# Patient Record
Sex: Male | Born: 1985 | Race: White | Hispanic: No | Marital: Married | Smoking: Current every day smoker
Health system: Southern US, Community
[De-identification: ages and names within clinical notes are randomized; demographics above are authoritative.]

## PROBLEM LIST (undated history)

## (undated) HISTORY — PX: BONY PELVIS SURGERY: SHX572

## (undated) HISTORY — PX: KNEE SURGERY: SHX244

## (undated) HISTORY — PX: TONSILLECTOMY: SUR1361

## (undated) HISTORY — PX: INNER EAR SURGERY: SHX679

---

## 2015-08-25 ENCOUNTER — Encounter: Payer: Self-pay | Admitting: Emergency Medicine

## 2015-08-25 ENCOUNTER — Emergency Department: Payer: 59

## 2015-08-25 ENCOUNTER — Emergency Department
Admission: EM | Admit: 2015-08-25 | Discharge: 2015-08-25 | Disposition: A | Discharge status: Home / Self Care | Payer: 59 | Attending: Emergency Medicine | Admitting: Emergency Medicine

## 2015-08-25 DIAGNOSIS — R197 Diarrhea, unspecified: Secondary | ICD-10-CM | POA: Diagnosis present

## 2015-08-25 DIAGNOSIS — Z720 Tobacco use: Secondary | ICD-10-CM | POA: Diagnosis not present

## 2015-08-25 DIAGNOSIS — K529 Noninfective gastroenteritis and colitis, unspecified: Secondary | ICD-10-CM

## 2015-08-25 DIAGNOSIS — R1030 Lower abdominal pain, unspecified: Secondary | ICD-10-CM | POA: Insufficient documentation

## 2015-08-25 LAB — LIPASE, BLOOD: Lipase: 30 U/L (ref 22–51)

## 2015-08-25 LAB — COMPREHENSIVE METABOLIC PANEL
ALBUMIN: 4.9 g/dL (ref 3.5–5.0)
ALK PHOS: 62 U/L (ref 38–126)
ALT: 38 U/L (ref 17–63)
AST: 28 U/L (ref 15–41)
Anion gap: 10 (ref 5–15)
BILIRUBIN TOTAL: 0.9 mg/dL (ref 0.3–1.2)
BUN: 16 mg/dL (ref 6–20)
CO2: 22 mmol/L (ref 22–32)
CREATININE: 0.93 mg/dL (ref 0.61–1.24)
Calcium: 9.4 mg/dL (ref 8.9–10.3)
Chloride: 106 mmol/L (ref 101–111)
GFR calc Af Amer: >60 mL/min (ref >60)
GFR calc non Af Amer: >60 mL/min (ref >60)
GLUCOSE: 131 mg/dL — AB (ref 65–99)
Potassium: 4.3 mmol/L (ref 3.5–5.1)
Sodium: 138 mmol/L (ref 135–145)
TOTAL PROTEIN: 8.3 g/dL — AB (ref 6.5–8.1)

## 2015-08-25 LAB — URINALYSIS COMPLETE WITH MICROSCOPIC (ARMC ONLY)
Bilirubin Urine: NEGATIVE
GLUCOSE, UA: NEGATIVE mg/dL
Hgb urine dipstick: NEGATIVE
Ketones, ur: NEGATIVE mg/dL
NITRITE: NEGATIVE
Protein, ur: NEGATIVE mg/dL
SPECIFIC GRAVITY, URINE: 1.032 — AB (ref 1.005–1.030)
pH: 5 (ref 5.0–8.0)

## 2015-08-25 LAB — CBC
HEMATOCRIT: 46.8 % (ref 40.0–52.0)
Hemoglobin: 16.3 g/dL (ref 13.0–18.0)
MCH: 31.7 pg (ref 26.0–34.0)
MCHC: 34.8 g/dL (ref 32.0–36.0)
MCV: 91.2 fL (ref 80.0–100.0)
Platelets: 254 10*3/uL (ref 150–440)
RBC: 5.13 MIL/uL (ref 4.40–5.90)
RDW: 13.3 % (ref 11.5–14.5)
WBC: 22.1 10*3/uL — ABNORMAL HIGH (ref 3.8–10.6)

## 2015-08-25 MED ORDER — SODIUM CHLORIDE 0.9 % IV BOLUS (SEPSIS)
1000.0000 mL | Freq: Once | INTRAVENOUS | Status: AC
  Administered 2015-08-25: 1000 mL via INTRAVENOUS

## 2015-08-25 MED ORDER — MORPHINE SULFATE (PF) 4 MG/ML IV SOLN
4.0000 mg | Freq: Once | INTRAVENOUS | Status: DC
  Filled 2015-08-25: qty 1

## 2015-08-25 MED ORDER — ONDANSETRON 4 MG PO TBDP: 4.0000 mg | ORAL_TABLET | Freq: Three times a day (TID) | ORAL | Status: AC | PRN

## 2015-08-25 MED ORDER — IOHEXOL 300 MG/ML  SOLN
125.0000 mL | Freq: Once | INTRAMUSCULAR | Status: DC | PRN
  Filled 2015-08-25: qty 125

## 2015-08-25 MED ORDER — ONDANSETRON HCL 4 MG/2ML IJ SOLN
4.0000 mg | Freq: Once | INTRAMUSCULAR | Status: AC | PRN
  Administered 2015-08-25: 4 mg via INTRAVENOUS
  Filled 2015-08-25: qty 2

## 2015-08-25 MED ORDER — SODIUM CHLORIDE 0.9 % IV BOLUS (SEPSIS): 1000.0000 mL | Freq: Once | INTRAVENOUS | Status: DC

## 2015-08-25 MED ORDER — PROMETHAZINE HCL 25 MG/ML IJ SOLN
INTRAMUSCULAR | Status: AC
  Administered 2015-08-25: 25 mg via INTRAVENOUS
  Filled 2015-08-25: qty 1

## 2015-08-25 MED ORDER — PROMETHAZINE HCL 25 MG/ML IJ SOLN
25.0000 mg | Freq: Once | INTRAMUSCULAR | Status: AC
  Administered 2015-08-25: 25 mg via INTRAVENOUS

## 2015-08-25 MED ORDER — IOHEXOL 240 MG/ML SOLN
25.0000 mL | Freq: Once | INTRAMUSCULAR | Status: AC | PRN
  Administered 2015-08-25: 25 mL via ORAL

## 2015-08-25 NOTE — Discharge Instructions (Signed)
You have been seen in the emergency department for nausea, vomiting, diarrhea. Your workup is most consistent with gastroenteritis. Please take your prescribed nausea medication as needed, as written. Please use over-the-counter Imodium/loperamide for diarrhea, as needed, as written on the box. Return to the emergency department if you have any worsening abdominal pain, fever, or any other symptom personally concerning to yourself.

## 2015-08-25 NOTE — ED Notes (Signed)
MD at bedside for reeval

## 2015-08-25 NOTE — ED Notes (Signed)
Patient presents to the ED with nausea, vomiting, and diarrhea starting this morning.  Patient reports vomiting greater than 10 times and diarrhea as often.  Patient states stool is a yellowish color.  Reports emesis that is yellow or green.  Patient denies abdominal pain but reports, "queasiness".  Patient reports lower back pain as well.  Patient reports eating [place] food last night, wife also ate the same meal without any illness at this time.

## 2015-08-25 NOTE — ED Provider Notes (Signed)
[facility] Emergency Department Provider Note  Time seen: 7:12 PM  I have reviewed the triage vital signs and the nursing notes.   HISTORY  Chief Complaint Diarrhea and Emesis    HPI Brandon Booth is a 29 y.o. male with no past medical history who presents the emergency department with nausea, vomiting, abdominal pain. According to the patient since 8:00 this morning he has been very nauseated, vomiting and also having very loose diarrhea. Patient is also having lower abdominal pain. Describes his abdominal pain is moderate, radiates around to bilateral lower back. Denies fever. No modifying factors identified. No recent sick contacts per patient.     History reviewed. No pertinent past medical history.  There are no active problems to display for this patient.   Past Surgical History  Procedure Laterality Date  . Knee surgery    . Tonsillectomy    . Bony pelvis surgery    . Inner ear surgery      No current outpatient prescriptions on file.  Allergies Review of patient's allergies indicates no known allergies.  History reviewed. No pertinent family history.  Social History Social History  Substance Use Topics  . Smoking status: Current Every Day Smoker -- 0.75 packs/day    Types: Cigarettes  . Smokeless tobacco: None  . Alcohol Use: No    Review of Systems Constitutional: Negative for fever. Cardiovascular: Negative for chest pain. Respiratory: Negative for shortness of breath. Gastrointestinal: Positive lower abdominal pain, nausea, vomiting and diarrhea. Genitourinary: Negative for dysuria. Musculoskeletal: Pain radiating around to bilateral lower back. Neurological: Negative for headache Allergic/Immunilogical: **} 10-point ROS otherwise negative.  ____________________________________________   PHYSICAL EXAM:  VITAL SIGNS: ED Triage Vitals  Enc Vitals Group     BP 08/25/15 1818 139/79 mmHg     Pulse Rate 08/25/15 1818  116     Resp 08/25/15 1818 18     Temp --      Temp src --      SpO2 08/25/15 1818 98 %     Weight 08/25/15 1818 244 lb (110.678 kg)     Height 08/25/15 1818  (1.803 m)     Head Cir --      Peak Flow --      Pain Score 08/25/15 1827 6     Pain Loc --      Pain Edu? --      Excl. in GC? --    Constitutional: Alert and oriented. Well appearing and in no distress. Eyes: Normal exam ENT   Head: Normocephalic and atraumatic.   Mouth/Throat: Mucous membranes are moist. Cardiovascular: Regular rhythm, rate around 100 bpm. No murmur. Respiratory: Normal respiratory effort without tachypnea nor retractions. Breath sounds are clear and equal bilaterally. No wheezes/rales/rhonchi. Gastrointestinal: Soft, moderate lower abdominal tenderness, moderate right lower quadrant tenderness. No rebound or guarding. No distention. No CVA tenderness. Mild lumbar paraspinal tenderness palpation bilaterally. Musculoskeletal: Nontender with normal range of motion in all extremities. Neurologic:  Normal speech and language. No gross focal neurologic deficits are appreciated. Speech is normal. Skin:  Skin is warm, dry and intact.  Psychiatric: Mood and affect are normal. Speech and behavior are normal. Patient exhibits appropriate insight and judgment.  ____________________________________________      RADIOLOGY  CT shows fluid throughout colon.  ____________________________________________   INITIAL IMPRESSION / ASSESSMENT AND PLAN / ED COURSE  Pertinent labs & imaging results that were available during my care of the patient were reviewed by me and considered  in my medical decision making (see chart for details).  Patient presents for lower abdominal pain, lower and right lower quadrant abdominal tenderness on exam. Patient nauseated with vomiting and liquid diarrhea. Elevated white blood cell count of 22,000, given his tenderness and elevated white blood cell count we'll proceed with a  CT abdomen/pelvis. I discussed this with the patient is agreeable.  CT shows fluid throughout colon most consistent with diarrhea. Given this finding along with his symptoms patient is most likely experiencing a bout of gastroenteritis. Will discharge on Zofran, loperamide as needed. I discussed my typical abdominal pain return precautions which the patient is agreeable.  ____________________________________________   FINAL CLINICAL IMPRESSION(S) / ED DIAGNOSES  Abdominal pain Nausea and vomiting Diarrhea Gastroenteritis  Minna Antis, MD 08/25/15 2032

## 2016-02-08 IMAGING — CT CT ABD-PELV W/ CM
1 of 2 series · 15 of 32 positions shown, 19 images · IV contrast (omnipaque)
Comparison: None.

CLINICAL DATA: 29-year-old male with nausea vomiting diarrhea since
this morning. Initial encounter.

EXAM:
CT ABDOMEN AND PELVIS WITH CONTRAST
TECHNIQUE: Multidetector CT imaging of the abdomen and pelvis was performed
using the standard protocol following bolus administration of
intravenous contrast.
CONTRAST:  125 mL Omnipaque 300

[Series 2: routine abd pel with · axial · 0.79mm/px · z∈[-897,-417]mm · 15 of 106 slices shown, 19 images]
[im 5/106  soft-tissue]
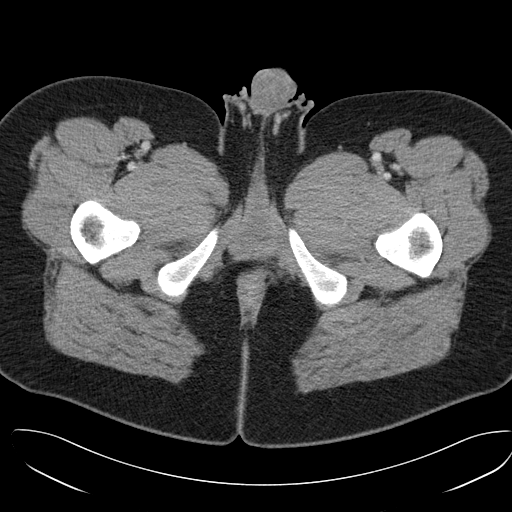
[im 5/106  bone]
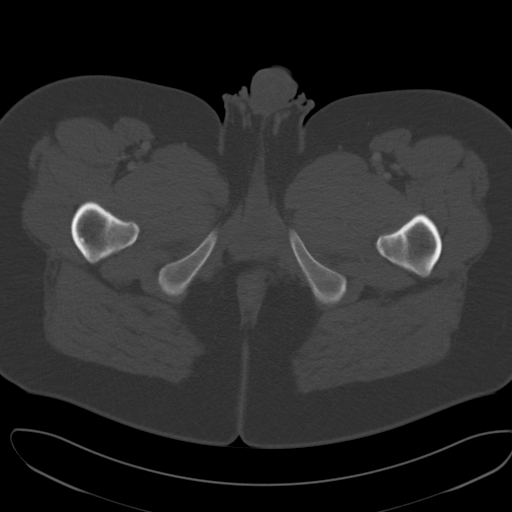
[im 15/106  soft-tissue]
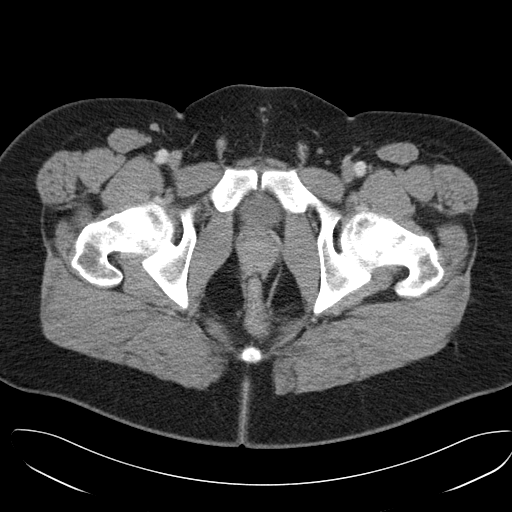
[im 24/106  soft-tissue]
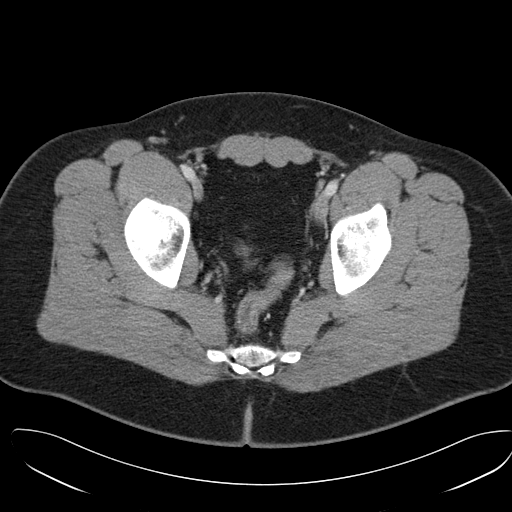
[im 29/106  soft-tissue]
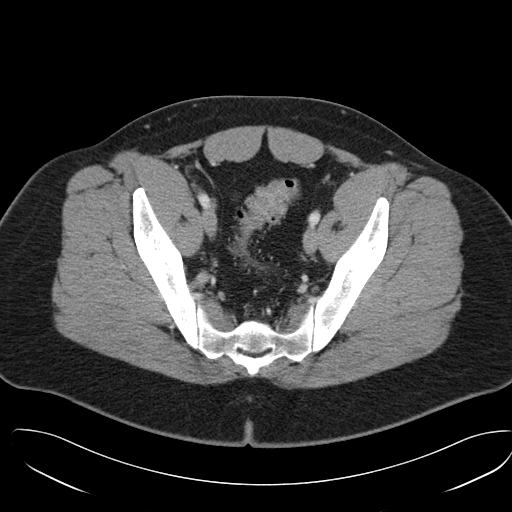
[im 39/106  soft-tissue]
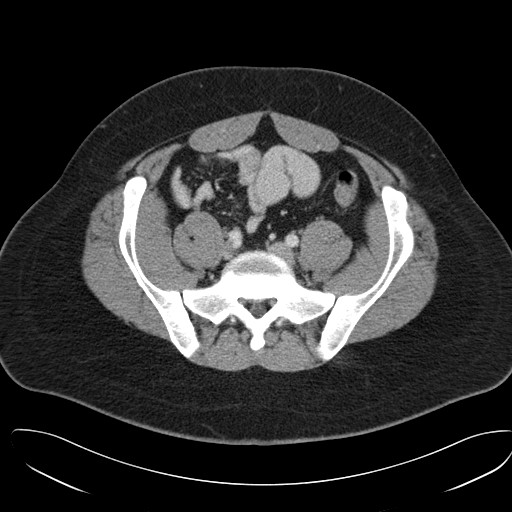
[im 43/106  soft-tissue]
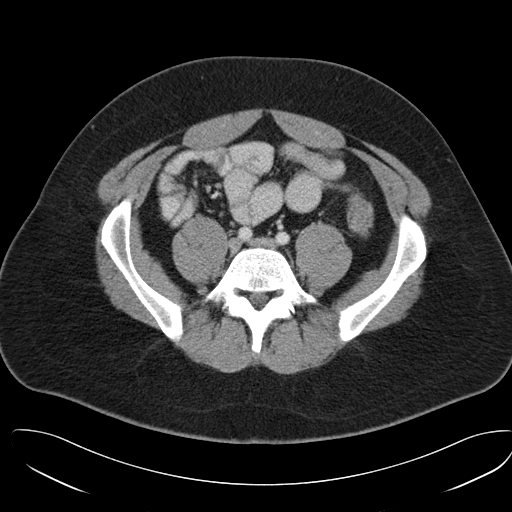
[im 53/106  soft-tissue]
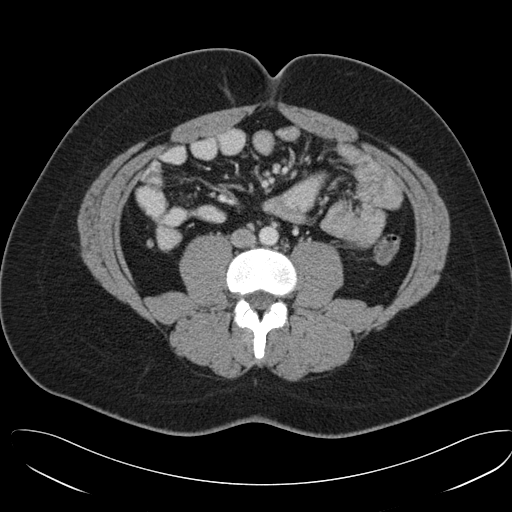
[im 63/106  soft-tissue]
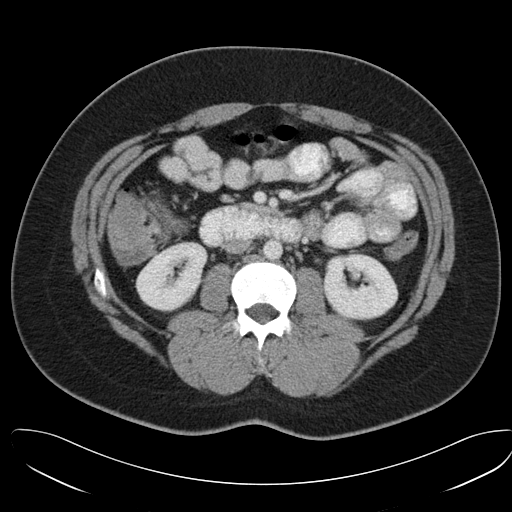
[im 67/106  soft-tissue]
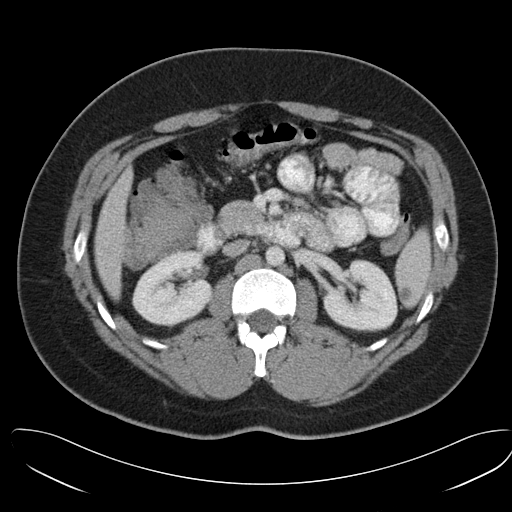
[im 67/106  bone]
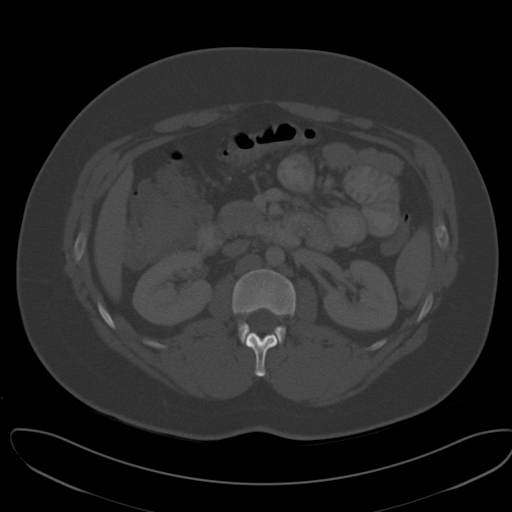
[im 77/106  soft-tissue]
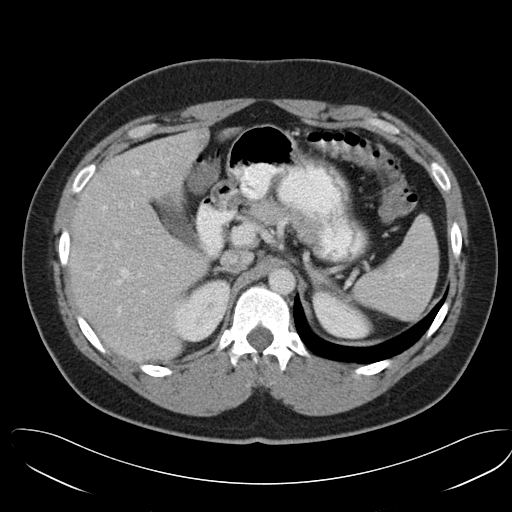
[im 82/106  soft-tissue]
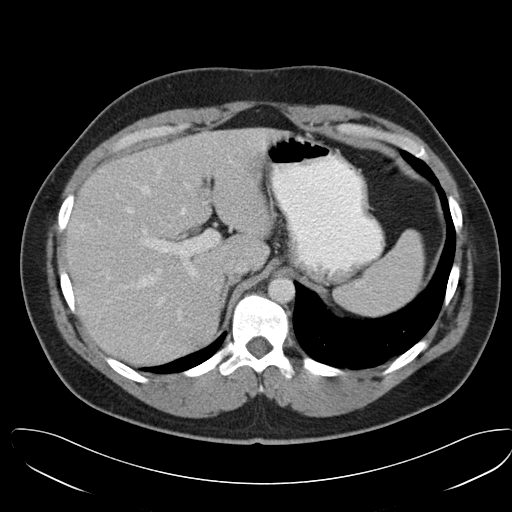
[im 86/106  lung]
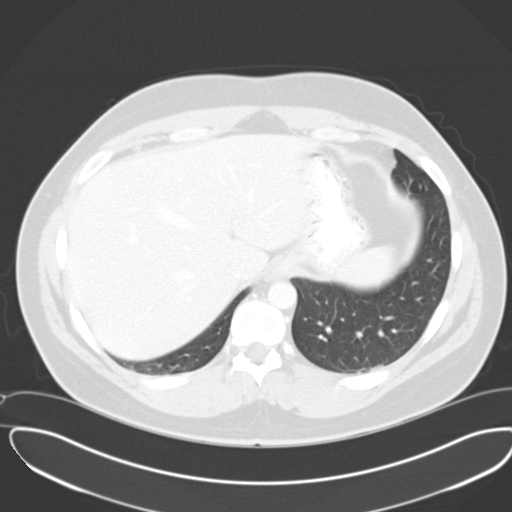
[im 91/106  soft-tissue]
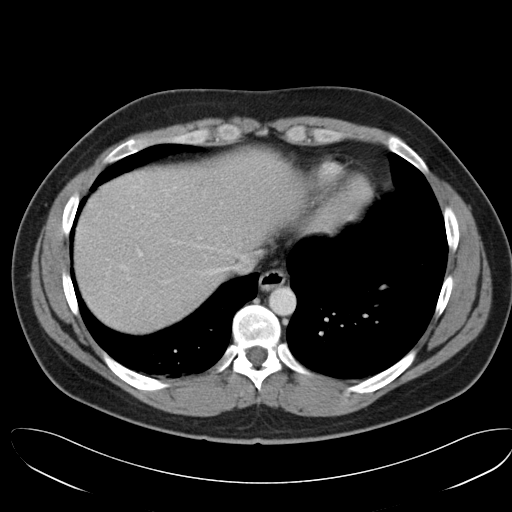
[im 91/106  lung]
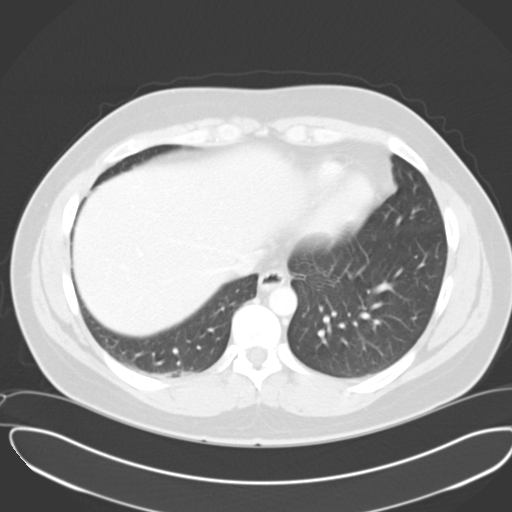
[im 96/106  lung]
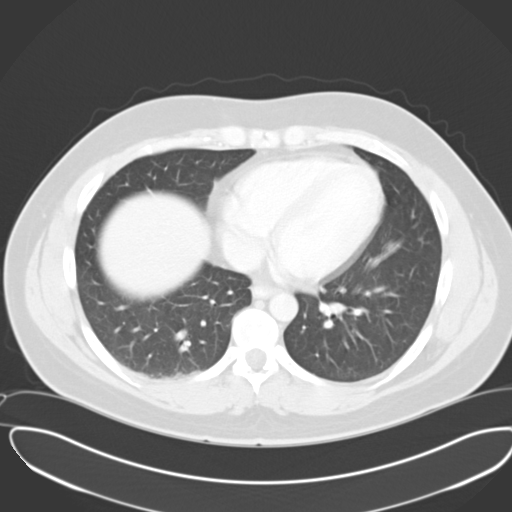
[im 101/106  soft-tissue]
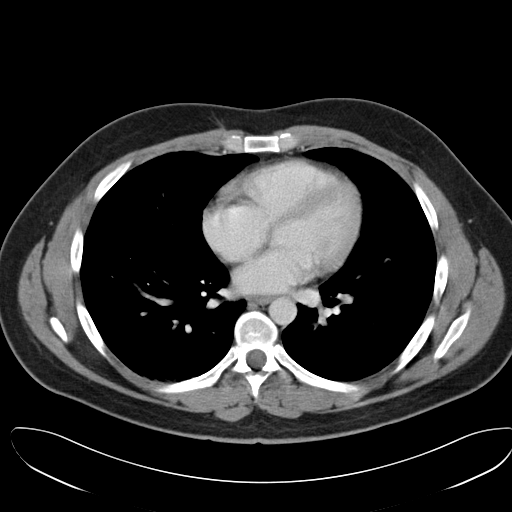
[im 101/106  lung]
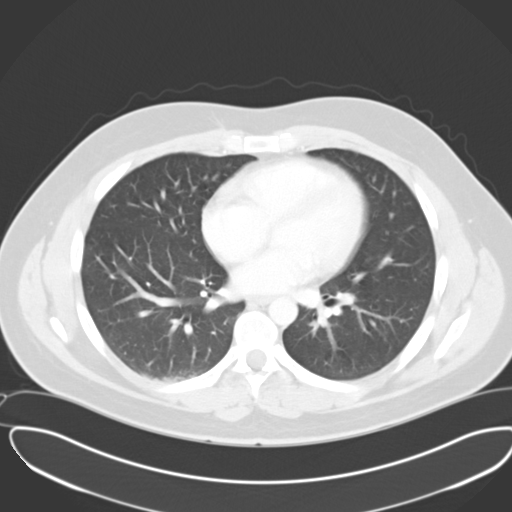

[15 of 32 positions shown; findings below may reference images not displayed]

FINDINGS: Minor lung base atelectasis greater on the right. No pericardial or
pleural effusion.

No acute osseous abnormality identified. Small benign bone island at
the posterior right acetabulum.

No pelvic free fluid. There is fluid in the rectum which otherwise
appears unremarkable. Diminutive urinary bladder. Negative sigmoid
colon.

Some fluid in the left colon which otherwise is decompressed. Fluid
in the transverse and right colon. Normal appendix (coronal image
76). Negative terminal ileum. No dilated or abnormal small bowel.
Negative stomach and duodenum.

No abdominal free air or free fluid. Liver, gallbladder, pancreas
and adrenal glands are within normal limits. The spleen is normal
aside from a small 10 mm low-density area at the inferior pole of
the spleen (series 2, image 41), probably either a benign cyst or
hemangioma. Portal venous system is patent. Major arterial
structures are patent. No lymphadenopathy. Both kidneys and ureters
appear normal.
IMPRESSION: Fluid throughout much of the colon in keeping with diarrhea.
Otherwise negative CT Abdomen and Pelvis. Normal appendix.
